# Patient Record
Sex: Female | Born: 1960
Health system: Southern US, Community
[De-identification: ages and names within clinical notes are randomized; demographics above are authoritative.]

## PROBLEM LIST (undated history)

## (undated) DIAGNOSIS — I10 Essential (primary) hypertension: Secondary | ICD-10-CM

## (undated) HISTORY — PX: OOPHORECTOMY: SHX86

---

## 1997-05-01 ENCOUNTER — Inpatient Hospital Stay (HOSPITAL_COMMUNITY): Admission: AD | Admit: 1997-05-01 | Discharge: 1997-05-03 | Payer: Self-pay | Admitting: Obstetrics and Gynecology

## 1997-11-10 ENCOUNTER — Ambulatory Visit (HOSPITAL_COMMUNITY): Admission: RE | Admit: 1997-11-10 | Discharge: 1997-11-10 | Payer: Self-pay | Admitting: Obstetrics and Gynecology

## 1998-08-10 ENCOUNTER — Other Ambulatory Visit: Admission: RE | Admit: 1998-08-10 | Discharge: 1998-08-10 | Payer: Self-pay | Admitting: Obstetrics and Gynecology

## 1999-02-05 ENCOUNTER — Inpatient Hospital Stay (HOSPITAL_COMMUNITY): Admission: AD | Admit: 1999-02-05 | Discharge: 1999-02-05 | Payer: Self-pay | Admitting: Obstetrics & Gynecology

## 2000-07-31 ENCOUNTER — Emergency Department (HOSPITAL_COMMUNITY): Admission: EM | Admit: 2000-07-31 | Discharge: 2000-08-01 | Payer: Self-pay | Admitting: Emergency Medicine

## 2000-08-16 ENCOUNTER — Ambulatory Visit (HOSPITAL_COMMUNITY): Admission: RE | Admit: 2000-08-16 | Discharge: 2000-08-16 | Payer: Self-pay | Admitting: *Deleted

## 2000-08-16 ENCOUNTER — Encounter: Payer: Self-pay | Admitting: *Deleted

## 2000-08-17 ENCOUNTER — Encounter: Payer: Self-pay | Admitting: *Deleted

## 2001-09-26 ENCOUNTER — Encounter: Admission: RE | Admit: 2001-09-26 | Discharge: 2001-09-26 | Payer: Self-pay | Admitting: Family Medicine

## 2001-09-26 ENCOUNTER — Encounter: Payer: Self-pay | Admitting: Family Medicine

## 2003-04-20 ENCOUNTER — Other Ambulatory Visit: Admission: RE | Admit: 2003-04-20 | Discharge: 2003-04-20 | Payer: Self-pay | Admitting: Obstetrics and Gynecology

## 2006-01-25 ENCOUNTER — Ambulatory Visit (HOSPITAL_COMMUNITY): Admission: RE | Admit: 2006-01-25 | Discharge: 2006-01-25 | Payer: Self-pay | Admitting: Obstetrics and Gynecology

## 2006-10-12 ENCOUNTER — Ambulatory Visit (HOSPITAL_COMMUNITY): Admission: RE | Admit: 2006-10-12 | Discharge: 2006-10-12 | Payer: Self-pay | Admitting: Obstetrics and Gynecology

## 2010-05-24 NOTE — Op Note (Signed)
Daisy Stephens, Daisy Stephens NO.:  [id number]   MEDICAL RECORD NO.:  [id number]          PATIENT TYPE:  AMB   LOCATION:  SDC                           FACILITY:  [facility]   PHYSICIAN:  Malva Limes, M.D.    DATE OF BIRTH:  April 20, 1960   DATE OF PROCEDURE:  10/12/2006  DATE OF DISCHARGE:                               OPERATIVE REPORT   PREOPERATIVE DIAGNOSIS:  Menorrhagia.   POSTOPERATIVE DIAGNOSIS:  Menorrhagia.   PROCEDURE:  Hysteroscopy with rollerball endometrial ablation.   SURGEON:  Malva Limes, M.D.   ANESTHESIA:  General.   ANTIBIOTICS:  Ancef 1 gram.   DRAINS:  Foley catheter to bladder.   SPECIMENS:  None.   COMPLICATIONS:  None.   ESTIMATED BLOOD LOSS:  Minimal.   DESCRIPTION OF PROCEDURE:  The patient was taken to the operating room  where she was placed in the dorsal supine position.  General anesthesia  was administered without complications.  She was then prepped and draped  in the usual fashion for this procedure.  Her bladder was drained with a  catheter.  Sterile speculum was placed in the vagina.  17 mL of 1%  lidocaine was used for paracervical block.  Single tooth tenaculum was  applied to the anterior cervical lip.  The cervix was serially dilated  to a 31 [place].  The hysteroscope was passed through the endocervical  canal.  On entering the uterine cavity, the patient's ostia were easily  visualized.  There was no evidence of any polyps or submucosal fibroids.  At this point, the resectoscope was placed into the uterine cavity and  the rollerball used to ablate the entire endometrial lining.  Once this  was completed, the procedure was concluded.  The patient was awakened  and taken to the recovery room in stable condition.  She will be  discharged to home.  She will be sent home with Percocet to take p.r.n.  She will follow up in the office in two weeks.           ______________________________  Malva Limes, M.D.     MA/MEDQ   D:  10/12/2006  T:  10/12/2006  Job:  [id number]

## 2010-05-27 NOTE — Op Note (Signed)
NAMESABRINIA, PRIEN NO.:  [id number]   MEDICAL RECORD NO.:  [id number]          PATIENT TYPE:  AMB   LOCATION:  SDC                           FACILITY:  [facility]   PHYSICIAN:  Malva Limes, M.D.    DATE OF BIRTH:  03/22/1960   DATE OF PROCEDURE:  01/25/2006  DATE OF DISCHARGE:                               OPERATIVE REPORT   PREOPERATIVE DIAGNOSIS:  Menorrhagia.   POSTOPERATIVE DIAGNOSIS:  Menorrhagia.   OPERATION PERFORMED:  Novasure endometrial ablation.   SURGEON:  Malva Limes, M.D.   ANESTHESIA:  MAC with paracervical block.   DRAINS:  None.   ANTIBIOTICS:  Ancef 1 g.   ESTIMATED BLOOD LOSS:  Minimal.   COMPLICATIONS:  None.   DESCRIPTION OF PROCEDURE:  The patient was taken to the operating room  and placed in dorsal lithotomy position.  MAC anesthesia was  administered without complications.  She was then prepped with Betadine  and draped in the usual fashion for this procedure.  Examination under  anesthesia revealed an 8-week sized uterus.  The patient has two known  small fibroids.  A weighted speculum was placed in the vagina.  20 mL of  1% lidocaine was used for paracervical block.  The cervix was then  serially dilated to a 19 [place].  The uterus was sounded to 8 cm.  The  cervical length was found to be 3 cm.  Therefore the cavitary length was  5 cm.  At this point the Novasure device was advanced to the fundus and  opened.  The width of the device was 3.8 cm.  A seal test was performed  and passed.  At this point the device was turned on for 54 seconds.  Total wattage of 105.  Once this was completed, the device was removed.  The patient tolerated the procedure well.  She was taken to recovery  room in stable condition.  Instrument and lap counts correct x 1.  The  patient was discharged to home.  She was instructed to follow up in the  office in four weeks.  She was sent home with Percocet to take p.r.n.     ______________________________  Malva Limes, M.D.     MA/MEDQ  D:  01/25/2006  T:  01/25/2006  Job:  [id number]

## 2010-10-20 LAB — CBC
HCT: 35.9 — ABNORMAL LOW
Hemoglobin: 12
MCHC: 33.4
MCV: 77.3 — ABNORMAL LOW
Platelets: 248
RBC: 4.64
RDW: 15.2 — ABNORMAL HIGH
WBC: 6.8

## 2010-10-20 LAB — BASIC METABOLIC PANEL
BUN: 9
CO2: 28
Calcium: 9
Chloride: 103
Creatinine, Ser: 0.64
GFR calc Af Amer: >60
GFR calc non Af Amer: >60
Glucose, Bld: 91
Potassium: 3.5
Sodium: 135

## 2010-10-20 LAB — PREGNANCY, URINE: Preg Test, Ur: NEGATIVE

## 2013-11-06 ENCOUNTER — Other Ambulatory Visit (HOSPITAL_COMMUNITY)
Admission: RE | Admit: 2013-11-06 | Discharge: 2013-11-06 | Disposition: A | Discharge status: Home / Self Care | Payer: Self-pay | Source: Ambulatory Visit | Attending: Family Medicine | Admitting: Family Medicine

## 2013-11-06 ENCOUNTER — Other Ambulatory Visit: Payer: Self-pay | Admitting: Family Medicine

## 2013-11-06 DIAGNOSIS — Z1151 Encounter for screening for human papillomavirus (HPV): Secondary | ICD-10-CM | POA: Insufficient documentation

## 2013-11-06 DIAGNOSIS — Z124 Encounter for screening for malignant neoplasm of cervix: Secondary | ICD-10-CM | POA: Insufficient documentation

## 2013-11-06 DIAGNOSIS — Z113 Encounter for screening for infections with a predominantly sexual mode of transmission: Secondary | ICD-10-CM | POA: Insufficient documentation

## 2013-11-07 LAB — CYTOLOGY - PAP

## 2015-09-16 ENCOUNTER — Emergency Department (HOSPITAL_COMMUNITY)
Admission: EM | Admit: 2015-09-16 | Discharge: 2015-09-16 | Disposition: A | Discharge status: Home / Self Care | Payer: Commercial Managed Care - HMO | Attending: Emergency Medicine | Admitting: Emergency Medicine

## 2015-09-16 ENCOUNTER — Encounter (HOSPITAL_COMMUNITY): Payer: Self-pay

## 2015-09-16 DIAGNOSIS — Y92410 Unspecified street and highway as the place of occurrence of the external cause: Secondary | ICD-10-CM | POA: Insufficient documentation

## 2015-09-16 DIAGNOSIS — M549 Dorsalgia, unspecified: Secondary | ICD-10-CM | POA: Diagnosis present

## 2015-09-16 DIAGNOSIS — Z5321 Procedure and treatment not carried out due to patient leaving prior to being seen by health care provider: Secondary | ICD-10-CM | POA: Insufficient documentation

## 2015-09-16 DIAGNOSIS — Y9389 Activity, other specified: Secondary | ICD-10-CM | POA: Diagnosis not present

## 2015-09-16 DIAGNOSIS — I10 Essential (primary) hypertension: Secondary | ICD-10-CM | POA: Insufficient documentation

## 2015-09-16 DIAGNOSIS — Y999 Unspecified external cause status: Secondary | ICD-10-CM | POA: Insufficient documentation

## 2015-09-16 HISTORY — DX: Essential (primary) hypertension: I10

## 2015-09-16 NOTE — ED Notes (Signed)
Bed: WA21 Expected date:  Expected time:  Means of arrival:  Comments: 
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
## 2015-09-16 NOTE — ED Triage Notes (Signed)
Called for patient.  Not found in lobby 

## 2015-09-16 NOTE — ED Notes (Signed)
Bed: WA26 Expected date:  Expected time:  Means of arrival:  Comments: 

## 2016-02-16 DIAGNOSIS — A084 Viral intestinal infection, unspecified: Secondary | ICD-10-CM | POA: Diagnosis not present

## 2016-06-23 DIAGNOSIS — Z0000 Encounter for general adult medical examination without abnormal findings: Secondary | ICD-10-CM | POA: Diagnosis not present

## 2016-06-23 DIAGNOSIS — R252 Cramp and spasm: Secondary | ICD-10-CM | POA: Diagnosis not present

## 2016-06-23 DIAGNOSIS — G4700 Insomnia, unspecified: Secondary | ICD-10-CM | POA: Diagnosis not present

## 2016-06-23 DIAGNOSIS — I10 Essential (primary) hypertension: Secondary | ICD-10-CM | POA: Diagnosis not present

## 2016-06-26 ENCOUNTER — Other Ambulatory Visit: Payer: Self-pay | Admitting: Family Medicine

## 2016-06-26 ENCOUNTER — Other Ambulatory Visit (HOSPITAL_COMMUNITY)
Admission: RE | Admit: 2016-06-26 | Discharge: 2016-06-26 | Disposition: A | Discharge status: Home / Self Care | Payer: 59 | Source: Ambulatory Visit | Attending: Family Medicine | Admitting: Family Medicine

## 2016-06-26 DIAGNOSIS — Z01419 Encounter for gynecological examination (general) (routine) without abnormal findings: Secondary | ICD-10-CM | POA: Insufficient documentation

## 2016-06-26 DIAGNOSIS — R21 Rash and other nonspecific skin eruption: Secondary | ICD-10-CM | POA: Diagnosis not present

## 2016-06-26 DIAGNOSIS — Z0000 Encounter for general adult medical examination without abnormal findings: Secondary | ICD-10-CM | POA: Diagnosis not present

## 2016-06-27 ENCOUNTER — Telehealth: Payer: Self-pay

## 2016-06-27 ENCOUNTER — Other Ambulatory Visit: Payer: Self-pay | Admitting: Family Medicine

## 2016-06-27 DIAGNOSIS — Z1231 Encounter for screening mammogram for malignant neoplasm of breast: Secondary | ICD-10-CM

## 2016-06-27 NOTE — Telephone Encounter (Signed)
SENT NOTES TO SCHEDULING 

## 2016-06-28 LAB — CYTOLOGY - PAP: Diagnosis: NEGATIVE

## 2016-07-21 ENCOUNTER — Ambulatory Visit: Payer: Commercial Managed Care - HMO

## 2016-08-14 DIAGNOSIS — I10 Essential (primary) hypertension: Secondary | ICD-10-CM | POA: Insufficient documentation

## 2016-08-14 DIAGNOSIS — Z01419 Encounter for gynecological examination (general) (routine) without abnormal findings: Secondary | ICD-10-CM | POA: Diagnosis not present

## 2016-08-14 DIAGNOSIS — N393 Stress incontinence (female) (male): Secondary | ICD-10-CM | POA: Insufficient documentation

## 2016-08-16 ENCOUNTER — Other Ambulatory Visit: Payer: Self-pay | Admitting: Obstetrics and Gynecology

## 2016-08-16 DIAGNOSIS — R928 Other abnormal and inconclusive findings on diagnostic imaging of breast: Secondary | ICD-10-CM

## 2016-08-25 ENCOUNTER — Ambulatory Visit
Admission: RE | Admit: 2016-08-25 | Discharge: 2016-08-25 | Disposition: A | Discharge status: Home / Self Care | Payer: Commercial Managed Care - HMO | Source: Ambulatory Visit | Attending: Obstetrics and Gynecology | Admitting: Obstetrics and Gynecology

## 2016-08-25 ENCOUNTER — Ambulatory Visit: Payer: Commercial Managed Care - HMO

## 2016-08-25 DIAGNOSIS — R928 Other abnormal and inconclusive findings on diagnostic imaging of breast: Secondary | ICD-10-CM

## 2016-08-25 DIAGNOSIS — R922 Inconclusive mammogram: Secondary | ICD-10-CM | POA: Diagnosis not present

## 2017-10-31 ENCOUNTER — Encounter: Payer: Self-pay | Admitting: Cardiology

## 2017-10-31 DIAGNOSIS — G4700 Insomnia, unspecified: Secondary | ICD-10-CM | POA: Diagnosis not present

## 2017-10-31 DIAGNOSIS — I10 Essential (primary) hypertension: Secondary | ICD-10-CM | POA: Diagnosis not present

## 2018-02-01 DIAGNOSIS — Z0000 Encounter for general adult medical examination without abnormal findings: Secondary | ICD-10-CM | POA: Diagnosis not present

## 2018-02-01 DIAGNOSIS — Z1211 Encounter for screening for malignant neoplasm of colon: Secondary | ICD-10-CM | POA: Diagnosis not present

## 2018-02-01 DIAGNOSIS — I10 Essential (primary) hypertension: Secondary | ICD-10-CM | POA: Diagnosis not present

## 2018-02-08 DIAGNOSIS — I10 Essential (primary) hypertension: Secondary | ICD-10-CM | POA: Diagnosis not present

## 2018-02-21 ENCOUNTER — Other Ambulatory Visit: Payer: Self-pay

## 2018-02-23 NOTE — Progress Notes (Deleted)
Cardiology Office Note   Date:  02/23/2018   ID:  Daisy Stephens, DOB December 18, 1960, MRN [id number]  PCP:  Levi Aland, MD  Cardiologist:   No primary care provider on file. Referring:  ***  No chief complaint on file.     History of Present Illness: Daisy Stephens is a 58 y.o. female who is referred by *** for evaluation of chest pain.  ***   She recently was started on amlodipine for hypertension.  Past Medical History:  Diagnosis Date  . Hypertension     Past Surgical History:  Procedure Laterality Date  . OOPHORECTOMY       Current Outpatient Medications  Medication Sig Dispense Refill  . amLODipine-benazepril (LOTREL) 5-40 MG capsule Take 1 capsule by mouth daily.    . APPLE CIDER VINEGAR PO Take by mouth.    . traZODone (DESYREL) 50 MG tablet trazodone 50 mg tablet    . valACYclovir (VALTREX) 1000 MG tablet valacyclovir 1 gram tablet     No current facility-administered medications for this visit.     Allergies:   Patient has no active allergies.    Social History:  The patient  reports that she has never smoked. She has never used smokeless tobacco. She reports current alcohol use. She reports that she does not use drugs.   Family History:  The patient's ***family history includes Diabetes in her mother; High blood pressure in her mother; High blood pressure (age of onset: 36) in her father; Kidney cancer (age of onset: 23) in her mother; Stroke in her brother.    ROS:  Please see the history of present illness.   Otherwise, review of systems are positive for insomnia,***.   All other systems are reviewed and negative.    PHYSICAL EXAM: VS:  There were no vitals taken for this visit. , BMI There is no height or weight on file to calculate BMI. GENERAL:  Well appearing HEENT:  Pupils equal round and reactive, fundi not visualized, oral mucosa unremarkable NECK:  No jugular venous distention, waveform within normal limits, carotid upstroke brisk and  symmetric, no bruits, no thyromegaly LYMPHATICS:  No cervical, inguinal adenopathy LUNGS:  Clear to auscultation bilaterally BACK:  No CVA tenderness CHEST:  Unremarkable HEART:  PMI not displaced or sustained,S1 and S2 within normal limits, no S3, no S4, no clicks, no rubs, *** murmurs ABD:  Flat, positive bowel sounds normal in frequency in pitch, no bruits, no rebound, no guarding, no midline pulsatile mass, no hepatomegaly, no splenomegaly EXT:  2 plus pulses throughout, no edema, no cyanosis no clubbing SKIN:  No rashes no nodules NEURO:  Cranial nerves II through XII grossly intact, motor grossly intact throughout PSYCH:  Cognitively intact, oriented to person place and time    EKG:  EKG {ACTION; IS/IS GNO:[id number]} ordered today. The ekg ordered today demonstrates ***   Recent Labs: No results found for requested labs within last 8760 hours.    Lipid Panel No results found for: CHOL, TRIG, HDL, CHOLHDL, VLDL, LDLCALC, LDLDIRECT    Wt Readings from Last 3 Encounters:  No data found for Wt      Other studies Reviewed: Additional studies/ records that were reviewed today include: ***. Review of the above records demonstrates:  Please see elsewhere in the note.  ***   ASSESSMENT AND PLAN:  CHEST PAIN:  ***   PALPITATIONS:  ***   Current medicines are reviewed at length with the patient today.  The  patient {ACTIONS; HAS/DOES NOT HAVE:19233} concerns regarding medicines.  The following changes have been made:  {PLAN; NO CHANGE:13088:s}  Labs/ tests ordered today include: *** No orders of the defined types were placed in this encounter.    Disposition:   FU with ***    Signed, Rollene Rotunda, MD  02/23/2018 1:59 PM    [facility]

## 2018-02-26 ENCOUNTER — Ambulatory Visit: Payer: 59 | Admitting: Cardiology

## 2018-07-17 ENCOUNTER — Other Ambulatory Visit: Payer: Self-pay | Admitting: Critical Care Medicine

## 2018-07-17 DIAGNOSIS — Z20822 Contact with and (suspected) exposure to covid-19: Secondary | ICD-10-CM

## 2018-07-22 LAB — NOVEL CORONAVIRUS, NAA: SARS-CoV-2, NAA: NOT DETECTED

## 2018-11-20 ENCOUNTER — Ambulatory Visit: Payer: 59 | Admitting: Podiatry

## 2018-11-20 ENCOUNTER — Encounter: Payer: Self-pay | Admitting: Podiatry

## 2018-11-20 ENCOUNTER — Other Ambulatory Visit: Payer: Self-pay | Admitting: Podiatry

## 2018-11-20 ENCOUNTER — Other Ambulatory Visit: Payer: Self-pay

## 2018-11-20 ENCOUNTER — Ambulatory Visit (INDEPENDENT_AMBULATORY_CARE_PROVIDER_SITE_OTHER): Payer: 59

## 2018-11-20 VITALS — BP 194/111

## 2018-11-20 DIAGNOSIS — M79672 Pain in left foot: Secondary | ICD-10-CM | POA: Diagnosis not present

## 2018-11-20 DIAGNOSIS — M779 Enthesopathy, unspecified: Secondary | ICD-10-CM

## 2018-11-20 DIAGNOSIS — M7751 Other enthesopathy of right foot: Secondary | ICD-10-CM | POA: Diagnosis not present

## 2018-11-20 DIAGNOSIS — M79671 Pain in right foot: Secondary | ICD-10-CM

## 2018-11-20 DIAGNOSIS — M7752 Other enthesopathy of left foot: Secondary | ICD-10-CM

## 2018-11-20 NOTE — Progress Notes (Signed)
Subjective:   Patient ID: Daisy Stephens, female   DOB: 58 y.o.   MRN: [id number]   HPI Patient presents with chronic lesion formation plantar aspect both feet that are sore with inflammation and works on cement floors and states she needs orthotics to try to take pressure off her feet as they do get sore with activity.  Patient does have elevated blood pressure today that she is going to check and does not smoke likes to be active   Review of Systems  All other systems reviewed and are negative.       Objective:  Physical Exam Vitals signs and nursing note reviewed.  Constitutional:      Appearance: She is well-developed.  Pulmonary:     Effort: Pulmonary effort is normal.  Musculoskeletal: Normal range of motion.  Skin:    General: Skin is warm.  Neurological:     Mental Status: She is alert.     Neurovascular status intact muscle strength found to be adequate range of motion within normal limits.  Patient has plantar keratotic regions around the second metatarsal of both feet that are painful and make it hard for her to walk and patient is noted to have good digital perfusion well oriented x3 with blood pressure that is elevated currently     Assessment:  Inflammatory capsulitis with keratotic lesion bilateral that are painful when palpated     Plan:  H&P discussed condition and I recommended a [place] type orthotic to try to take all stress off the heel arch with padding around the metatarsal region bilateral.  She is casted for these today and I debrided lesions and these will be dispensed by ped orthotist  X-rays indicate that there is moderate depression of the arch bilateral with mild digital deformity bilateral

## 2018-12-19 ENCOUNTER — Other Ambulatory Visit: Payer: Self-pay

## 2018-12-19 ENCOUNTER — Ambulatory Visit: Payer: 59 | Admitting: Orthotics

## 2018-12-19 DIAGNOSIS — M79671 Pain in right foot: Secondary | ICD-10-CM

## 2018-12-19 DIAGNOSIS — M79672 Pain in left foot: Secondary | ICD-10-CM

## 2018-12-19 NOTE — Progress Notes (Signed)
Patient came in today to pick up custom made foot orthotics.  The goals were accomplished and the patient reported no dissatisfaction with said orthotics.  Patient was advised of breakin period and how to report any issues. 

## 2019-09-24 ENCOUNTER — Other Ambulatory Visit: Payer: 59

## 2020-11-29 ENCOUNTER — Ambulatory Visit
Admission: RE | Admit: 2020-11-29 | Discharge: 2020-11-29 | Disposition: A | Discharge status: Home / Self Care | Payer: Worker's Compensation | Source: Ambulatory Visit | Attending: Emergency Medicine | Admitting: Emergency Medicine

## 2020-11-29 ENCOUNTER — Other Ambulatory Visit: Payer: Self-pay | Admitting: Emergency Medicine

## 2020-11-29 DIAGNOSIS — M79642 Pain in left hand: Secondary | ICD-10-CM

## 2021-01-17 ENCOUNTER — Other Ambulatory Visit: Payer: Self-pay | Admitting: Obstetrics and Gynecology

## 2021-01-17 DIAGNOSIS — Z1231 Encounter for screening mammogram for malignant neoplasm of breast: Secondary | ICD-10-CM

## 2021-02-07 ENCOUNTER — Ambulatory Visit
Admission: RE | Admit: 2021-02-07 | Discharge: 2021-02-07 | Disposition: A | Discharge status: Home / Self Care | Payer: 59 | Source: Ambulatory Visit | Attending: Obstetrics and Gynecology | Admitting: Obstetrics and Gynecology

## 2021-02-07 DIAGNOSIS — Z1231 Encounter for screening mammogram for malignant neoplasm of breast: Secondary | ICD-10-CM

## 2021-03-01 ENCOUNTER — Ambulatory Visit: Payer: 59 | Admitting: Dermatology

## 2021-05-08 ENCOUNTER — Encounter (HOSPITAL_BASED_OUTPATIENT_CLINIC_OR_DEPARTMENT_OTHER): Payer: Self-pay | Admitting: *Deleted

## 2021-05-08 ENCOUNTER — Emergency Department (HOSPITAL_BASED_OUTPATIENT_CLINIC_OR_DEPARTMENT_OTHER): Payer: 59

## 2021-05-08 ENCOUNTER — Other Ambulatory Visit: Payer: Self-pay

## 2021-05-08 ENCOUNTER — Emergency Department (HOSPITAL_BASED_OUTPATIENT_CLINIC_OR_DEPARTMENT_OTHER)
Admission: EM | Admit: 2021-05-08 | Discharge: 2021-05-08 | Disposition: A | Discharge status: Home / Self Care | Payer: 59 | Attending: Emergency Medicine | Admitting: Emergency Medicine

## 2021-05-08 DIAGNOSIS — R519 Headache, unspecified: Secondary | ICD-10-CM | POA: Insufficient documentation

## 2021-05-08 DIAGNOSIS — R202 Paresthesia of skin: Secondary | ICD-10-CM | POA: Diagnosis not present

## 2021-05-08 DIAGNOSIS — Z79899 Other long term (current) drug therapy: Secondary | ICD-10-CM | POA: Insufficient documentation

## 2021-05-08 DIAGNOSIS — R200 Anesthesia of skin: Secondary | ICD-10-CM | POA: Diagnosis present

## 2021-05-08 DIAGNOSIS — I10 Essential (primary) hypertension: Secondary | ICD-10-CM | POA: Diagnosis not present

## 2021-05-08 LAB — COMPREHENSIVE METABOLIC PANEL
ALT: 10 U/L (ref 0–44)
AST: 12 U/L — ABNORMAL LOW (ref 15–41)
Albumin: 4.4 g/dL (ref 3.5–5.0)
Alkaline Phosphatase: 71 U/L (ref 38–126)
Anion gap: 9 (ref 5–15)
BUN: 20 mg/dL (ref 6–20)
CO2: 26 mmol/L (ref 22–32)
Calcium: 9.6 mg/dL (ref 8.9–10.3)
Chloride: 106 mmol/L (ref 98–111)
Creatinine, Ser: 0.7 mg/dL (ref 0.44–1.00)
GFR, Estimated: >60 mL/min (ref >60)
Glucose, Bld: 124 mg/dL — ABNORMAL HIGH (ref 70–99)
Potassium: 3.3 mmol/L — ABNORMAL LOW (ref 3.5–5.1)
Sodium: 141 mmol/L (ref 135–145)
Total Bilirubin: 0.4 mg/dL (ref 0.3–1.2)
Total Protein: 7.2 g/dL (ref 6.5–8.1)

## 2021-05-08 LAB — DIFFERENTIAL
Abs Immature Granulocytes: 0.02 10*3/uL (ref 0.00–0.07)
Basophils Absolute: 0 10*3/uL (ref 0.0–0.1)
Basophils Relative: 0 %
Eosinophils Absolute: 0.2 10*3/uL (ref 0.0–0.5)
Eosinophils Relative: 2 %
Immature Granulocytes: 0 %
Lymphocytes Relative: 33 %
Lymphs Abs: 2.7 10*3/uL (ref 0.7–4.0)
Monocytes Absolute: 0.6 10*3/uL (ref 0.1–1.0)
Monocytes Relative: 8 %
Neutro Abs: 4.6 10*3/uL (ref 1.7–7.7)
Neutrophils Relative %: 57 %

## 2021-05-08 LAB — CBC
HCT: 36.6 % (ref 36.0–46.0)
Hemoglobin: 12 g/dL (ref 12.0–15.0)
MCH: 27 pg (ref 26.0–34.0)
MCHC: 32.8 g/dL (ref 30.0–36.0)
MCV: 82.4 fL (ref 80.0–100.0)
Platelets: 245 10*3/uL (ref 150–400)
RBC: 4.44 MIL/uL (ref 3.87–5.11)
RDW: 13.2 % (ref 11.5–15.5)
WBC: 8.2 10*3/uL (ref 4.0–10.5)
nRBC: 0 % (ref 0.0–0.2)

## 2021-05-08 NOTE — ED Provider Notes (Signed)
?[facility] ?Provider Note ? ? ?CSN: [id number] ?Arrival date & time: 05/08/21  0130 ? ?  ? ?History ? ?Chief Complaint  ?Patient presents with  ? Other  ?  Left arm numbness/tingling   ? ? ?Daisy Stephens is a 61 y.o. female. ? ?The history is provided by the patient and a relative.  ?Neurologic Problem ?This is a new problem. The problem has been resolved. Associated symptoms include headaches. Pertinent negatives include no chest pain and no shortness of breath. Nothing aggravates the symptoms. Nothing relieves the symptoms. She has tried nothing for the symptoms.  ?Patient presents with left-sided numbness.  She reports that last evening she had an episode of numbness in the left face and left arm tingling.  The episode lasted about 5 minutes and resolved.  No focal weakness.  No facial weakness.  No visual changes.  She has similar episode earlier in the week.  She is already been seen by her PCP who is referring her to neurology and begin an outpatient work-up.  The episode earlier in the night occurred at work and she was encouraged to come to the ER. ?She has no previous history of stroke.  She has a previous history of hypertension which is being treated ?She reports she is ambulatory without difficulty ?  ? ?Home Medications ?Prior to Admission medications   ?Medication Sig Start Date End Date Taking? Authorizing Provider  ?amLODipine-benazepril (LOTREL) 5-40 MG capsule Take 1 capsule by mouth daily.    [provider]  ?APPLE CIDER VINEGAR PO Take by mouth.    [provider]  ?ibuprofen (ADVIL) 800 MG tablet Take 800 mg by mouth 3 (three) times daily. 09/06/18   [provider]  ?traZODone (DESYREL) 50 MG tablet trazodone 50 mg tablet    [provider]  ?valACYclovir (VALTREX) 1000 MG tablet valacyclovir 1 gram tablet    [provider]  ?   ? ?Allergies    ?Patient has no known allergies.   ? ?Review of Systems   ?Review of Systems   ?Constitutional:  Negative for fever.  ?Eyes:  Negative for visual disturbance.  ?Respiratory:  Negative for shortness of breath.   ?Cardiovascular:  Negative for chest pain.  ?Neurological:  Positive for headaches. Negative for dizziness, facial asymmetry, speech difficulty and weakness.  ? ?Physical Exam ?Updated Vital Signs ?BP 127/81   Pulse (!) 57   Temp 98.6 ?F (37 ?C) (Oral)   Resp 16   Ht 1.753 m (5\' 9" )   Wt 90.7 kg   SpO2 98%   BMI 29.53 kg/m?  ?Physical Exam ?CONSTITUTIONAL: Well developed/well nourished ?HEAD: Normocephalic/atraumatic ?EYES: EOMI/PERRL, no nystagmus, no visual ?field deficit  no ptosis ?ENMT: Mucous membranes moist ?NECK: supple no meningeal signs, no bruits ?CV: S1/S2 noted, no murmurs/rubs/gallops noted ?LUNGS: Lungs are clear to auscultation bilaterally, no apparent distress ?ABDOMEN: soft, nontender, no rebound or guarding ?GU:no cva tenderness ?NEURO:Awake/alert, face symmetric, no arm or leg drift is noted ?Equal 5/5 strength with shoulder abduction, elbow flex/extension, wrist flex/extension in upper extremities and equal hand grips bilaterally ?Equal 5/5 strength with hip flexion,knee flex/extension, foot dorsi/plantar flexion ?Cranial nerves 3/4/5/6/07/17/08/11/12 tested and intact ?No past pointing ?Sensation to light touch intact in all extremities ?EXTREMITIES: pulses normal, full ROM ?SKIN: warm, color normal ?PSYCH: no abnormalities of mood noted ? ? ?ED Results / Procedures / Treatments   ?Labs ?(all labs ordered are listed, but only abnormal results are displayed) ?Labs Reviewed  ?COMPREHENSIVE  METABOLIC PANEL - Abnormal; Notable for the following components:  ?    Result Value  ? Potassium 3.3 (*)   ? Glucose, Bld 124 (*)   ? AST 12 (*)   ? All other components within normal limits  ?CBC  ?DIFFERENTIAL  ? ? ?EKG ?EKG Interpretation ? ?Date/Time:  Sunday May 08 2021 05:13:27 EDT ?Ventricular Rate:  64 ?PR Interval:  186 ?QRS Duration: 102 ?QT Interval:  420 ?QTC  Calculation: 434 ?R Axis:   69 ?Text Interpretation: Sinus rhythm Probable anteroseptal infarct, old Minimal ST elevation, inferior leads Confirmed by Zadie RhineWickline, Aleysia Oltmann ([id number]) on 05/08/2021 5:39:07 AM ? ?Radiology ?CT HEAD WO CONTRAST ? ?Result Date: 05/08/2021 ?CLINICAL DATA:  61 year old female with intermittent left face and upper extremity paresthesia. EXAM: CT HEAD WITHOUT CONTRAST TECHNIQUE: Contiguous axial images were obtained from the base of the skull through the vertex without intravenous contrast. RADIATION DOSE REDUCTION: This exam was performed according to the departmental dose-optimization program which includes automated exposure control, adjustment of the mA and/or kV according to patient size and/or use of iterative reconstruction technique. COMPARISON:  None. FINDINGS: Brain: Normal cerebral volume. No midline shift, mass effect, or evidence of intracranial mass lesion. No ventriculomegaly. Moderately advanced patchy bilateral cerebral white matter hypodensity. Deep gray matter nuclei appear relatively spared. No acute or chronic cortically based infarct identified. No acute intracranial hemorrhage identified. Vascular: Calcified atherosclerosis at the skull base. No suspicious intracranial vascular hyperdensity. Skull: No osseous abnormality identified. Sinuses/Orbits: Visualized paranasal sinuses and mastoids are clear. Other: Visualized orbits and scalp soft tissues are within normal limits. IMPRESSION: Advanced for age cerebral white matter changes, nonspecific but most commonly due to chronic small vessel disease. Electronically Signed   By: Odessa FlemingH  Hall M.D.   On: 05/08/2021 06:31   ? ?Procedures ?Procedures  ? ? ?Medications Ordered in ED ?Medications - No data to display ? ?ED Course/ Medical Decision Making/ A&P ?Clinical Course as of 05/08/21 0644  ?Sun May 08, 2021  ?[id number] Work-up was overall unremarkable.  She has no new neurologic symptoms.  CT scan negative for ICH or other acute neurologic  emergency. [DW]  ?[phone] Patient can continue the outpatient work-up with her PCP.  At this time there is no signs of acute CVA.  We discussed strict return precautions [DW]  ?  ?Clinical Course User Index ?[DW] Zadie RhineWickline, Zayveon Raschke, MD  ? ?                        ?Medical Decision Making ?Amount and/or Complexity of Data Reviewed ?Labs: ordered. ?Radiology: ordered. ? ? ?This patient presents to the ED for concern of numbness, this involves an extensive number of treatment options, and is a complaint that carries with it a high risk of complications and morbidity.  The differential diagnosis includes but is not limited to electrolyte abnormality, hypokalemia, CVA, intracranial hemorrhage, brain tumor ? ?Comorbidities that complicate the patient evaluation: ?Patient?s presentation is complicated by their history of hypertension ? ? ?Lab Tests: ?I Ordered, and personally interpreted labs.  The pertinent results include: Mild hypokalemia ? ?Imaging Studies ordered: ?I ordered imaging studies including CT scan head   ?I independently visualized and interpreted imaging which showed negative ?I agree with the radiologist interpretation ? ? ?Medicines ordered and prescription drug management: ?Patient declined medication ? ?Test Considered: ?Considered MRI, but patient is symptom-free at this time, will defer further neuroimaging ? ? ?Reevaluation: ?After the interventions noted above, I reevaluated the patient  and found that they have :improved ? ?Complexity of problems addressed: ?Patient?s presentation is most consistent with  acute presentation with potential threat to life or bodily function ? ?Disposition: ?After consideration of the diagnostic results and the patient?s response to treatment,  ?I feel that the patent would benefit from discharge   .  ? ? ?6:46 AM ?Patient presented with brief isolated episodes of paresthesias that resolved after approximately 5 minutes.  She never had any visual changes or focal weakness.   She is already been seen as an outpatient and has this work-up in process.  Screening labs and CT head were overall unremarkable ?Patient to be discharged home.  We discussed strict return precautions ? ?

## 2021-05-08 NOTE — ED Notes (Signed)
Patient transported to CT 

## 2021-05-08 NOTE — ED Triage Notes (Signed)
C/o left lip numbness and left arm tingling/numbness that started on Monday. States "it doesn't last long" states that she saw her MD on Friday for the same symptoms. States she had this again tonight at work tonight at [employer]. States symptoms have resolved on arrival to ED. No obvious neuro symptoms noted. Pt is alert and oriented times 3, grips equal. Pupils equal  ?

## 2021-05-26 ENCOUNTER — Encounter: Payer: Self-pay | Admitting: Neurology

## 2021-05-26 ENCOUNTER — Ambulatory Visit: Payer: 59 | Admitting: Neurology

## 2021-05-26 VITALS — BP 134/86 | HR 78 | Ht 69.0 in | Wt 198.0 lb

## 2021-05-26 DIAGNOSIS — G4700 Insomnia, unspecified: Secondary | ICD-10-CM | POA: Insufficient documentation

## 2021-05-26 DIAGNOSIS — G4733 Obstructive sleep apnea (adult) (pediatric): Secondary | ICD-10-CM | POA: Diagnosis not present

## 2021-05-26 DIAGNOSIS — Z860100 Personal history of colon polyps, unspecified: Secondary | ICD-10-CM | POA: Insufficient documentation

## 2021-05-26 DIAGNOSIS — R252 Cramp and spasm: Secondary | ICD-10-CM | POA: Insufficient documentation

## 2021-05-26 DIAGNOSIS — R202 Paresthesia of skin: Secondary | ICD-10-CM | POA: Diagnosis not present

## 2021-05-26 DIAGNOSIS — J329 Chronic sinusitis, unspecified: Secondary | ICD-10-CM | POA: Insufficient documentation

## 2021-05-26 DIAGNOSIS — E1169 Type 2 diabetes mellitus with other specified complication: Secondary | ICD-10-CM | POA: Insufficient documentation

## 2021-05-26 DIAGNOSIS — R209 Unspecified disturbances of skin sensation: Secondary | ICD-10-CM | POA: Insufficient documentation

## 2021-05-26 NOTE — Progress Notes (Addendum)
Chief Complaint  Patient presents with   New Patient (Initial Visit)    Room 12 NP/Paper/Jessica Easley NP [place] [facility] [phone]/numbness/tingling, r/o TIA - fam hx of stroke Reports numbess and tingling x 3 weeks left arm left side of cheek, no pain, states sx lasts for a few mins       ASSESSMENT AND PLAN  Daisy Stephens is a 61 y.o. female   Sudden onset left-sided paresthesia  MRI of the brain to rule out right hemisphere small vessel TIA/stroke  Vascular risk factor of aging, hypertension, diabetes, obstructive sleep apnea, noncompliant with her CPAP machine   Obstructive sleep apnea  Referred for sleep study   DIAGNOSTIC DATA (LABS, IMAGING, TESTING) - I reviewed patient records, labs, notes, testing and imaging myself where available. Laboratory evaluation in December 2022, A1c 6.6, normal CMP with exception of elevated glucose 141, normal creatinine 0.71, LDL 99, triglyceride 202,  MEDICAL HISTORY:  Daisy Stephens, is a 61 year old female, accompanied by her daughter, seen in request by her primary care doctor Daisy Floro, for evaluation of numbness tingling of left side of the body, initial evaluation was on May 26, 2021  I reviewed and summarized the referring note. PMHX. HTN Obstructive sleep apnea, CPAP. Diabetes, A1c was 6.6 on December 21, 2020  Patient works long hours, sometimes up to 16 hours a day, often 7 times a week, she drives forklift, in May 2023, she had few episode of sudden onset numbness involving her left face, left hand, lasting for few minutes, no weakness, no dysarthria,  She was noted to have elevated A1c 6.6, made the diagnosis of diabetes, but never received any treatment  She has obstructive sleep apnea, but not compliant with her CPAP machine, has not had sleep study for over 3 years, does have loud snoring, often contributed her daytime fatigue, sleepiness due to overwork, lack of sleep,  PHYSICAL EXAM:    Vitals:   05/26/21 1055  BP: 134/86  Pulse: 78  Weight: 198 lb (89.8 kg)  Height: 5\' 9"  (1.753 m)    Body mass index is 29.24 kg/m.  PHYSICAL EXAMNIATION:  Gen: NAD, conversant, well nourised, well groomed                     Cardiovascular: Regular rate rhythm, no peripheral edema, warm, nontender. Eyes: Conjunctivae clear without exudates or hemorrhage Neck: Supple, no carotid bruits. Pulmonary: Clear to auscultation bilaterally   NEUROLOGICAL EXAM:  MENTAL STATUS: Speech/cognition: Awake, alert, oriented to history taking and casual conversation CRANIAL NERVES: CN II: Visual fields are full to confrontation. Pupils are round equal and briskly reactive to light. CN III, IV, VI: extraocular movement are normal. No ptosis. CN V: Facial sensation is intact to light touch CN VII: Face is symmetric with normal eye closure  CN VIII: Hearing is normal to causal conversation. CN IX, X: Phonation is normal. CN XI: Head turning and shoulder shrug are intact  MOTOR: There is no pronator drift of out-stretched arms. Muscle bulk and tone are normal. Muscle strength is normal.  REFLEXES: Reflexes are 2+ and symmetric at the biceps, triceps, knees, and ankles. Plantar responses are flexor.  SENSORY: Intact to light touch, pinprick and vibratory sensation are intact in fingers and toes.  COORDINATION: There is no trunk or limb dysmetria noted.  GAIT/STANCE: Posture is normal. Gait is steady with normal steps, base, arm swing, and turning. Heel and toe walking are normal. Tandem gait is  normal.  Romberg is absent.  REVIEW OF SYSTEMS:  Full 14 system review of systems performed and notable only for as above All other review of systems were negative.   ALLERGIES: No Known Allergies  HOME MEDICATIONS: Current Outpatient Medications  Medication Sig Dispense Refill   amLODipine-benazepril (LOTREL) 5-40 MG capsule Take 1 capsule by mouth daily.     APPLE CIDER VINEGAR PO  Take by mouth.     ibuprofen (ADVIL) 800 MG tablet Take 800 mg by mouth 3 (three) times daily.     traZODone (DESYREL) 50 MG tablet trazodone 50 mg tablet     valACYclovir (VALTREX) 1000 MG tablet valacyclovir 1 gram tablet     No current facility-administered medications for this visit.    PAST MEDICAL HISTORY: Past Medical History:  Diagnosis Date   Hypertension     PAST SURGICAL HISTORY: Past Surgical History:  Procedure Laterality Date   OOPHORECTOMY      FAMILY HISTORY: Family History  Problem Relation Age of Onset   Kidney cancer Mother 72   Diabetes Mother    High blood pressure Mother    High blood pressure Father 10   Stroke Brother     SOCIAL HISTORY: Social History   Socioeconomic History   Marital status: Married    Spouse name: Not on file   Number of children: Not on file   Years of education: Not on file   Highest education level: Not on file  Occupational History   Not on file  Tobacco Use   Smoking status: Never   Smokeless tobacco: Never  Substance and Sexual Activity   Alcohol use: Yes    Comment: social   Drug use: No   Sexual activity: Not on file  Other Topics Concern   Not on file  Social History Narrative   Not on file   Social Determinants of Health   Financial Resource Strain: Not on file  Food Insecurity: Not on file  Transportation Needs: Not on file  Physical Activity: Not on file  Stress: Not on file  Social Connections: Not on file  Intimate Partner Violence: Not on file      Levert Feinstein, M.D. Ph.D.  [facility] [address], [address] [place], [place] [address] Ph: [phone] Fax: [phone]  CC:  Dennie Maizes, NP [address] [place],  [place] [address]  Daisy Floro, MD  .Marland Kitchen

## 2021-06-01 ENCOUNTER — Telehealth: Payer: Self-pay | Admitting: Neurology

## 2021-06-01 MED ORDER — ALPRAZOLAM 0.5 MG PO TABS: 0.5000 mg | ORAL_TABLET | ORAL | 0 refills | Status: AC | PRN

## 2021-06-01 NOTE — Addendum Note (Signed)
Addended by: Cristela Felt E on: 06/01/2021 03:57 PM   Modules accepted: Orders

## 2021-06-01 NOTE — Telephone Encounter (Signed)
Orders pended for MD approval

## 2021-06-01 NOTE — Addendum Note (Signed)
Addended by: Levert Feinstein on: 06/01/2021 08:39 PM   Modules accepted: Orders

## 2021-06-01 NOTE — Telephone Encounter (Signed)
30 mins MRI brain wo contrast Dr. Terrace Arabia [facility] NPR case #[id number] scheduled at [facility] 06/15/21 at 10:30am

## 2021-06-01 NOTE — Telephone Encounter (Signed)
Patient would like meds for the MRI

## 2021-06-15 ENCOUNTER — Other Ambulatory Visit: Payer: 59

## 2021-06-28 ENCOUNTER — Other Ambulatory Visit: Payer: 59

## 2021-06-29 ENCOUNTER — Other Ambulatory Visit: Payer: 59

## 2021-07-05 ENCOUNTER — Ambulatory Visit (INDEPENDENT_AMBULATORY_CARE_PROVIDER_SITE_OTHER): Payer: 59

## 2021-07-05 DIAGNOSIS — R202 Paresthesia of skin: Secondary | ICD-10-CM | POA: Diagnosis not present

## 2021-07-07 ENCOUNTER — Telehealth: Payer: Self-pay | Admitting: Neurology

## 2021-07-07 DIAGNOSIS — I679 Cerebrovascular disease, unspecified: Secondary | ICD-10-CM

## 2021-07-07 NOTE — Telephone Encounter (Signed)
I spoke with the patient and informed her of the results. She verbalized understanding of the findings, will continue on medication. She inquired about her sleep study, stating she had not been contacted. I informed her that they were unable to leave a voicemail on her phone and had made multiple attempts to contact her. Will route to sleep lab to follow up. She expressed appreciation for the call.

## 2021-07-07 NOTE — Telephone Encounter (Signed)
Please call patient, MRI of the brain showed no acute abnormality, there is mild to moderate supratentorium small vessel disease, punctuate to right cerebellar chronic cerebral microhemorrhage  Above findings consistent with her multiple vascular risk factor, hypertension, diabetes, obstructive sleep apnea,   Advised her taking aspirin 81 mg daily, increase water intake,  I also ordered echocardiogram, ultrasound of carotid artery to complete evaluation  Orders Placed This Encounter  Procedures   ECHOCARDIOGRAM COMPLETE   VAS US CAROTID       MRI brain (without) demonstrating: - Mild chronic small vessel ischemic disease.  Punctate right cerebellar chronic cerebral microhemorrhage. - No acute findings.

## 2021-07-07 NOTE — Telephone Encounter (Signed)
I attempted to contact the patient. Her voicemail box was not set up and unable to receive messages. I left a voicemail on her husband, Tony's phone (as per DPR) asking for a return call.

## 2021-07-29 ENCOUNTER — Ambulatory Visit (HOSPITAL_COMMUNITY)
Admission: RE | Admit: 2021-07-29 | Discharge: 2021-07-29 | Disposition: A | Discharge status: Home / Self Care | Payer: 59 | Source: Ambulatory Visit | Attending: Neurology | Admitting: Neurology

## 2021-07-29 ENCOUNTER — Ambulatory Visit (HOSPITAL_BASED_OUTPATIENT_CLINIC_OR_DEPARTMENT_OTHER)
Admission: RE | Admit: 2021-07-29 | Discharge: 2021-07-29 | Disposition: A | Discharge status: Home / Self Care | Payer: 59 | Source: Ambulatory Visit | Attending: Neurology | Admitting: Neurology

## 2021-07-29 DIAGNOSIS — R079 Chest pain, unspecified: Secondary | ICD-10-CM

## 2021-07-29 DIAGNOSIS — I679 Cerebrovascular disease, unspecified: Secondary | ICD-10-CM | POA: Diagnosis not present

## 2021-07-29 DIAGNOSIS — E119 Type 2 diabetes mellitus without complications: Secondary | ICD-10-CM | POA: Insufficient documentation

## 2021-07-29 DIAGNOSIS — I249 Acute ischemic heart disease, unspecified: Secondary | ICD-10-CM | POA: Diagnosis not present

## 2021-07-29 DIAGNOSIS — G4730 Sleep apnea, unspecified: Secondary | ICD-10-CM | POA: Diagnosis not present

## 2021-07-29 DIAGNOSIS — I10 Essential (primary) hypertension: Secondary | ICD-10-CM | POA: Diagnosis not present

## 2021-07-29 LAB — ECHOCARDIOGRAM COMPLETE
Area-P 1/2: 3.06 cm2
S' Lateral: 2.6 cm

## 2021-07-29 NOTE — Progress Notes (Signed)
Carotid duplex has been completed.  Reading physician finalized exam before I could complete preliminary report.   Results can be found under chart review under CV PROC. 07/29/2021 3:28 PM Shawndrea Rutkowski RVT, RDMS

## 2021-09-28 NOTE — Progress Notes (Signed)
Chief Complaint  Patient presents with   Follow-up    Pt in room #2 and alone. Pt here for f/u.    HISTORY OF PRESENT ILLNESS:  10/03/21 ALL:  Daisy Stephens is a 61 y.o. female here today for follow up for paraesthesias. Hx of DM, HTN, and OSA noncompliant with CPAP. She was seen in consult with Dr Krista Blue 05/2021 for sudden onset of left sided paresthesias. MRI showed small vessel disease and right cerebellar punctate chronic microhemorrhage. Echo showed EF 65-70%. Carotid doppler showed no significant stenosis. She was referred to sleep med but has not called to schedule appt.   Since, she reports numbness of left face and tingling of left continued about 2-3 times a week until about a month ago. She stopped amlodipine as she thought this could be the cause. She reports paresthesias have nearly resolved since stopping amlodipine. She had one episode a day or two after stopping medication but paresthesias have not returned since. She saw her PCP about 2 weeks ago who started valsartan. She reports BP has been up and down.  She feels that sleep evaluation was approx 5 years ago. She does have a CPAP machine at home but not using it. She is uncertain where she was tested. She is willing to resume CPAP. She endorses loud snoring and husband tells her that she scares him at night as she stops breathing. ESS 2 for lying down to rest in the afternoons. She does not smoke. No alcohol. No stimulants or street drugs.    HISTORY (copied from Dr Rhea Belton previous note)  NAYVIE LIPS, is a 61 year old female, accompanied by her daughter, seen in request by her primary care doctor Lawerance Cruel, for evaluation of numbness tingling of left side of the body, initial evaluation was on May 26, 2021   I reviewed and summarized the referring note. PMHX. HTN Obstructive sleep apnea, CPAP. 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, she had few episode of sudden onset numbness involving her left face, left hand, lasting for few minutes, no weakness, no dysarthria,   She was noted to have elevated A1c 6.6, made the diagnosis of diabetes, but never received any treatment  She has obstructive sleep apnea, but not compliant with her CPAP machine, has not had sleep study for over 3 years, does have loud snoring, often contributed her daytime fatigue, sleepiness due to overwork, lack of sleep   REVIEW OF SYSTEMS: Out of a complete 14 system review of symptoms, the patient complains only of the following symptoms, snoring, and all other reviewed systems are negative.   ALLERGIES: No Known Allergies   HOME MEDICATIONS: Outpatient Medications Prior to Visit  Medication Sig Dispense Refill   ALPRAZolam (XANAX) 0.5 MG tablet Take 1 tablet (0.5 mg total) by mouth as needed for anxiety. Take 1 tablet by mouth 30 minutes prior to procedure. Repeat if needed. 3 tablet 0   APPLE CIDER VINEGAR PO Take by mouth.     ibuprofen (ADVIL) 800 MG tablet Take 800 mg by mouth 3 (three) times daily.     traZODone (DESYREL) 50 MG tablet trazodone 50 mg tablet     valACYclovir (VALTREX) 1000 MG tablet valacyclovir 1 gram tablet     amLODipine-benazepril (LOTREL) 5-40 MG capsule Take 1 capsule by mouth daily.  valsartan (DIOVAN) 320 MG tablet Take 320 mg by mouth daily.     No facility-administered medications prior to visit.     PAST MEDICAL HISTORY: Past Medical History:  Diagnosis Date   Hypertension      PAST SURGICAL HISTORY: Past Surgical History:  Procedure Laterality Date   OOPHORECTOMY       FAMILY HISTORY: Family History  Problem Relation Age of Onset   Kidney cancer Mother 25   Diabetes Mother    High blood pressure Mother    High blood pressure Father 21   Stroke Brother      SOCIAL HISTORY: Social History   Socioeconomic History   Marital status: Married     Spouse name: Not on file   Number of children: Not on file   Years of education: Not on file   Highest education level: Not on file  Occupational History   Not on file  Tobacco Use   Smoking status: Never   Smokeless tobacco: Never  Substance and Sexual Activity   Alcohol use: Yes    Comment: social   Drug use: No   Sexual activity: Not on file  Other Topics Concern   Not on file  Social History Narrative   Not on file   Social Determinants of Health   Financial Resource Strain: Not on file  Food Insecurity: Not on file  Transportation Needs: Not on file  Physical Activity: Not on file  Stress: Not on file  Social Connections: Not on file  Intimate Partner Violence: Not on file     PHYSICAL EXAM  Vitals:   10/03/21 1352 10/03/21 1413  BP: (!) 177/105 (!) 170/100  Pulse: 74   Weight: 193 lb 3.2 oz (87.6 kg)   Height: _0  (1.753 m)    Body mass index is 28.53 kg/m.  Generalized: Well developed, in no acute distress  Cardiology: normal rate and rhythm, no murmur auscultated  Respiratory: clear to auscultation bilaterally    Neurological examination  Mentation: Alert oriented to time, place, history taking. Follows all commands speech and language fluent Cranial nerve II-XII: Pupils were equal round reactive to light. Extraocular movements were full, visual field were full on confrontational test. Facial sensation and strength were normal. Head turning and shoulder shrug  were normal and symmetric. Motor: The motor testing reveals 5 over 5 strength of all 4 extremities. Good symmetric motor tone is noted throughout.  Gait and station: Gait is normal.   DIAGNOSTIC DATA (LABS, IMAGING, TESTING) - I reviewed patient records, labs, notes, testing and imaging myself where available.  Lab Results  Component Value Date   WBC 8.2 05/08/2021   HGB 12.0 05/08/2021   HCT 36.6 05/08/2021   MCV 82.4 05/08/2021   PLT 245 05/08/2021      Component Value Date/Time    NA 141 05/08/2021 0518   K 3.3 (L) 05/08/2021 0518   CL 106 05/08/2021 0518   CO2 26 05/08/2021 0518   GLUCOSE 124 (H) 05/08/2021 0518   BUN 20 05/08/2021 0518   CREATININE 0.70 05/08/2021 0518   CALCIUM 9.6 05/08/2021 0518   PROT 7.2 05/08/2021 0518   ALBUMIN 4.4 05/08/2021 0518   AST 12 (L) 05/08/2021 0518   ALT 10 05/08/2021 0518   ALKPHOS 71 05/08/2021 0518   BILITOT 0.4 05/08/2021 0518   GFRNONAA >60 05/08/2021 0518   GFRAA  10/11/2006 1015    >60        The eGFR has been calculated using  the MDRD equation. This calculation has not been validated in all clinical   No results found for: "CHOL", "HDL", "LDLCALC", "LDLDIRECT", "TRIG", "CHOLHDL" No results found for: "HGBA1C" No results found for: "VITAMINB12" No results found for: "TSH"      No data to display               No data to display           ASSESSMENT AND PLAN  61 y.o. year old female  has a past medical history of Hypertension. here with    Paresthesia  Obstructive sleep apnea  Cerebral vascular disease  Hypertension, unspecified type  Lukisha L Novosad reports paresthesias have resolved in the past month since stopping amlodipine-benazepril. BP has been up and down on valsartan. She is monitoring closely with RN at work. She is scheduled to call Dr Harrington Challenger with BP log in 2 weeks. We have reviewed results of MRI, ECHO and carotid doppler. I have encouraged her to continue monitoring BP closely and to call to schedule sleep evaluation with our sleep providers. She is interested in resuming CPAP. Healthy lifestyle habits encouraged. She will follow up with PCP as directed. She will return to see me pending sleep evaluation. She verbalizes understanding and agreement with this plan.   No orders of the defined types were placed in this encounter.    No orders of the defined types were placed in this encounter.    Debbora Presto, MSN, FNP-C 10/03/2021, 2:27 PM  [facility] [address], [place], [place] [address] [phone]

## 2021-09-28 NOTE — Patient Instructions (Addendum)
Below is our plan:  We will continue to monitor. Please keep an eye on your BP at home. Follow up closely with Dr Harrington Challenger for BP management. Call sleep lab to schedule appt for sleep consult. [phone]  Please make sure you are staying well hydrated. I recommend 50-60 ounces daily. Well balanced diet and regular exercise encouraged. Consistent sleep schedule with 6-8 hours recommended.   Please continue follow up with care team as directed.   Follow up pending sleep consult   You may receive a survey regarding today's visit. I encourage you to leave honest feed back as I do use this information to improve patient care. Thank you for seeing me today!

## 2021-10-03 ENCOUNTER — Encounter: Payer: Self-pay | Admitting: Family Medicine

## 2021-10-03 ENCOUNTER — Ambulatory Visit: Payer: 59 | Admitting: Family Medicine

## 2021-10-03 VITALS — BP 170/100 | HR 74 | Ht 69.0 in | Wt 193.2 lb

## 2021-10-03 DIAGNOSIS — G4733 Obstructive sleep apnea (adult) (pediatric): Secondary | ICD-10-CM

## 2021-10-03 DIAGNOSIS — I679 Cerebrovascular disease, unspecified: Secondary | ICD-10-CM | POA: Diagnosis not present

## 2021-10-03 DIAGNOSIS — R202 Paresthesia of skin: Secondary | ICD-10-CM

## 2021-10-03 DIAGNOSIS — I10 Essential (primary) hypertension: Secondary | ICD-10-CM

## 2022-07-24 IMAGING — CT CT HEAD W/O CM
4 series · 16 of 47 positions shown, 18 images · non-contrast
Comparison: None.

CLINICAL DATA: 61-year-old female with intermittent left face and
upper extremity paresthesia.



[Series 2: head wo · axial · 0.40mm/px · z∈[+645,+755]mm · 7 of 30 slices shown, 9 images]
[im 4/30  brain]
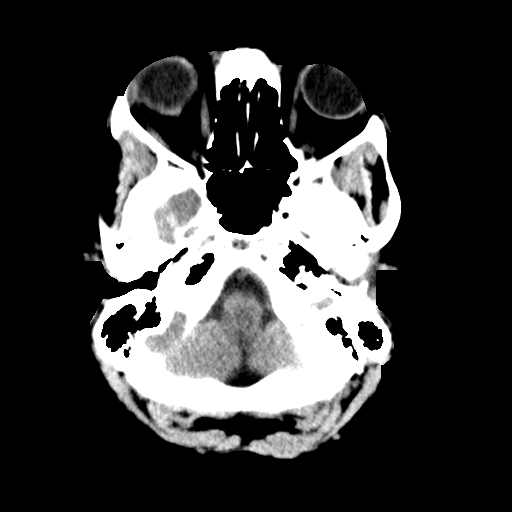
[im 4/30  bone]
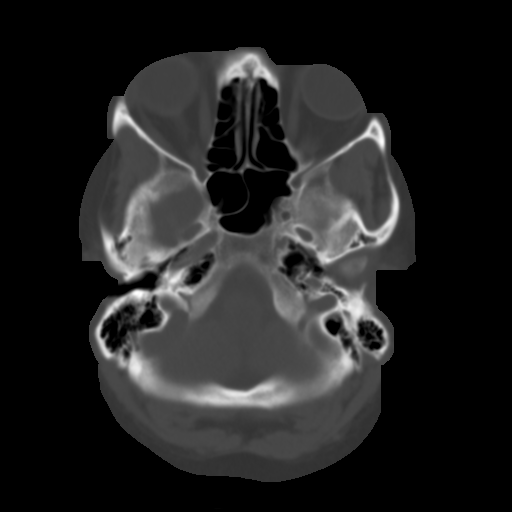
[im 8/30  brain]
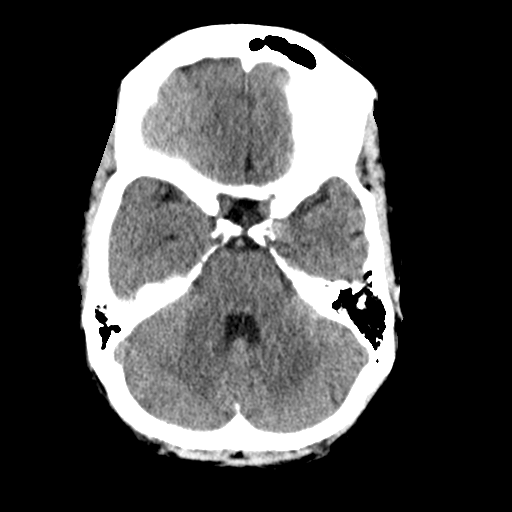
[im 11/30  brain]
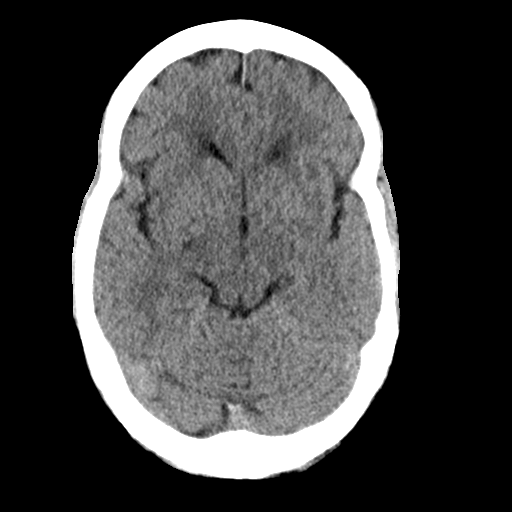
[im 15/30  brain]
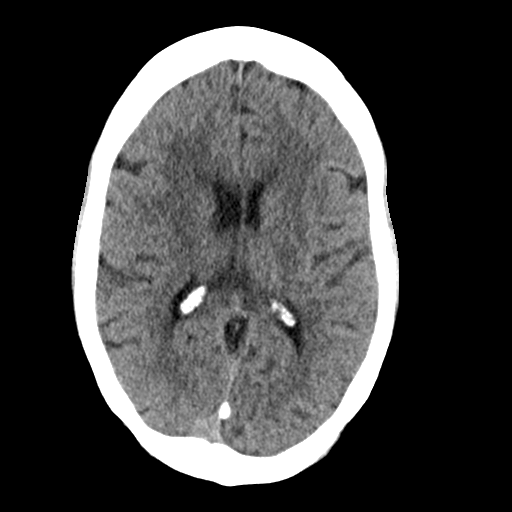
[im 19/30  brain]
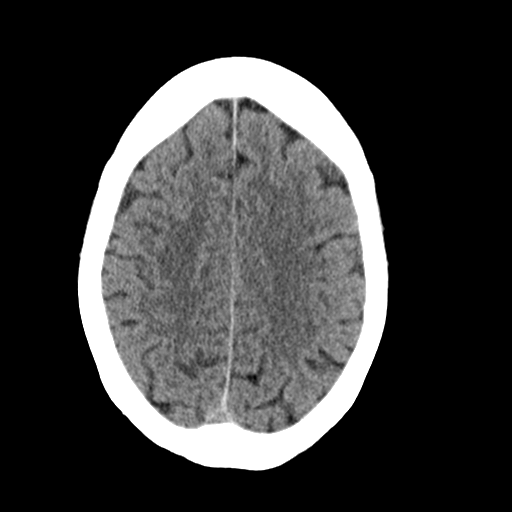
[im 19/30  bone]
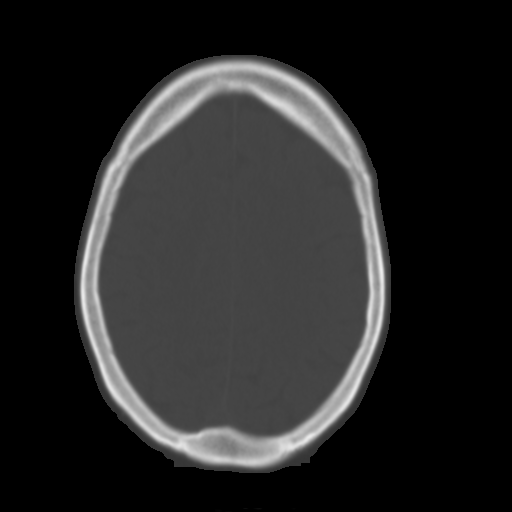
[im 22/30  brain]
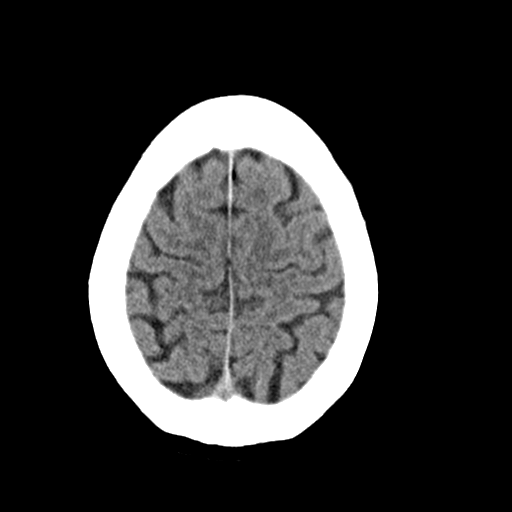
[im 26/30  brain]
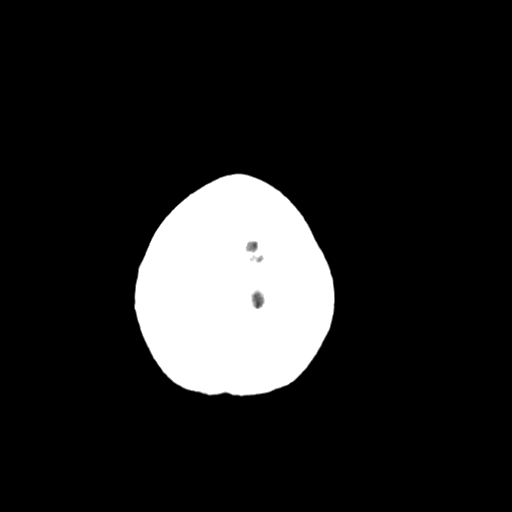

[Series 3: head bone · axial · 0.40mm/px · z∈[+644,+674]mm · 3 of 75 slices shown]
[im 8/75  bone]
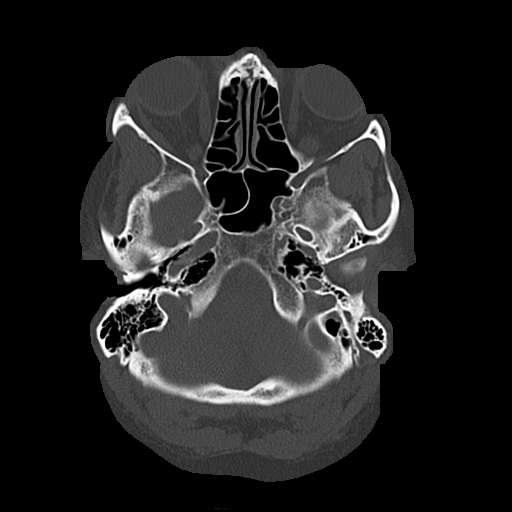
[im 15/75  bone]
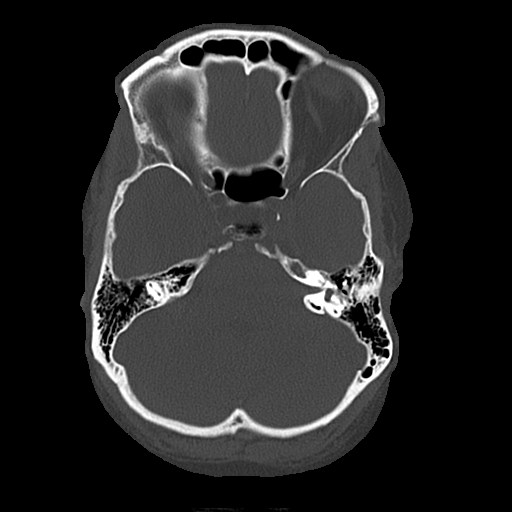
[im 23/75  bone]
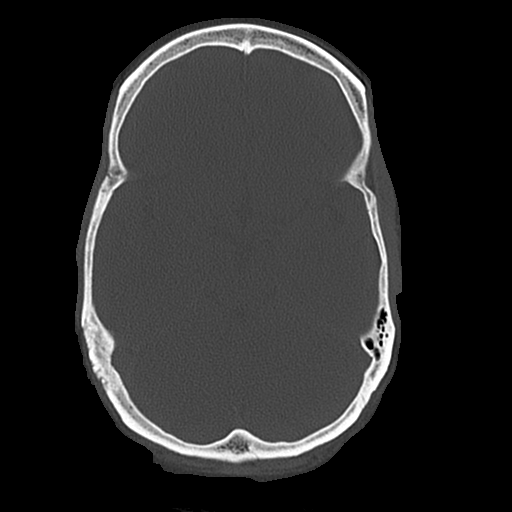

[Series 4: coronal soft · coronal · 0.29mm/px · 3 of 68 slices shown]
[im 23/68  brain]
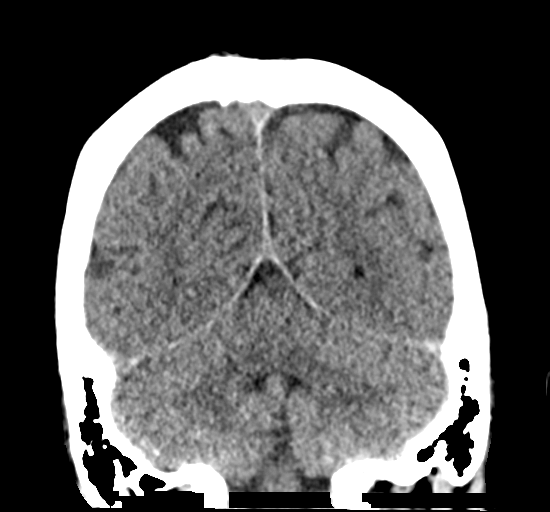
[im 30/68  brain]
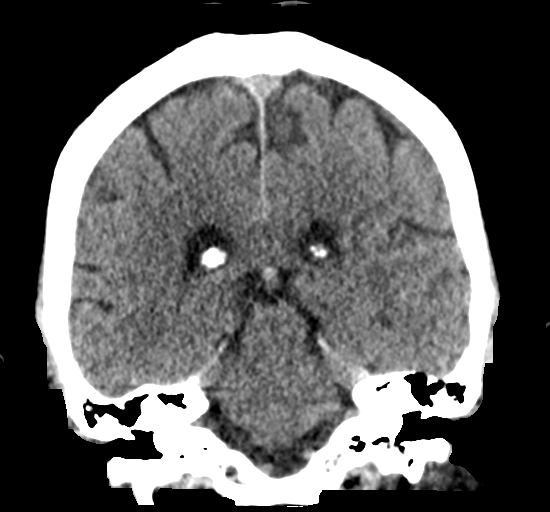
[im 38/68  brain]
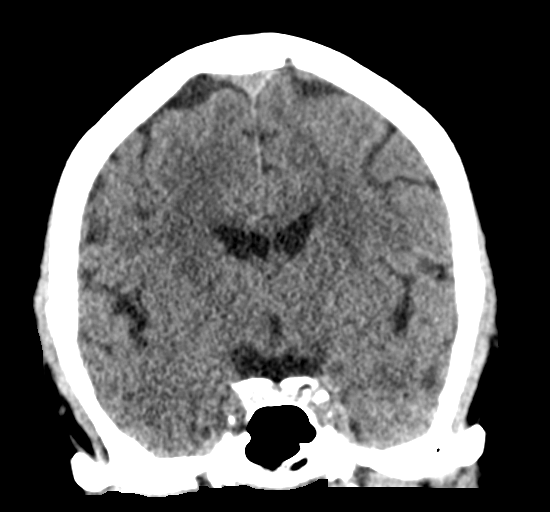

[Series 5: sagittal soft · sagittal · 0.29mm/px · 3 of 54 slices shown]
[im 18/54  brain]
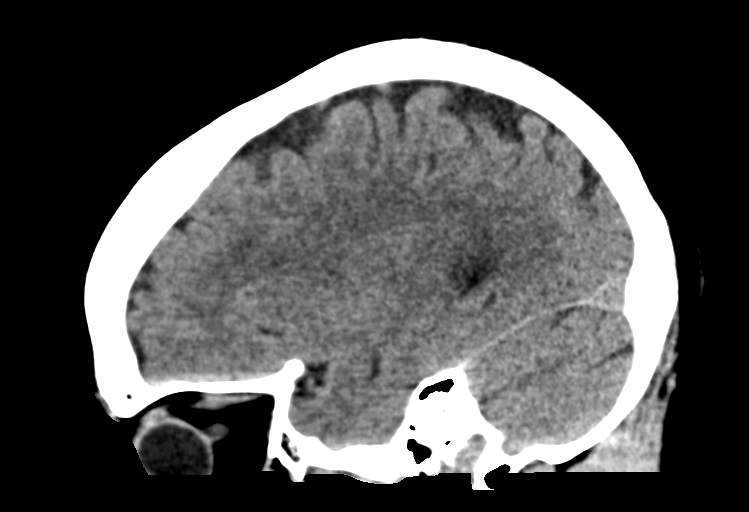
[im 27/54  brain]
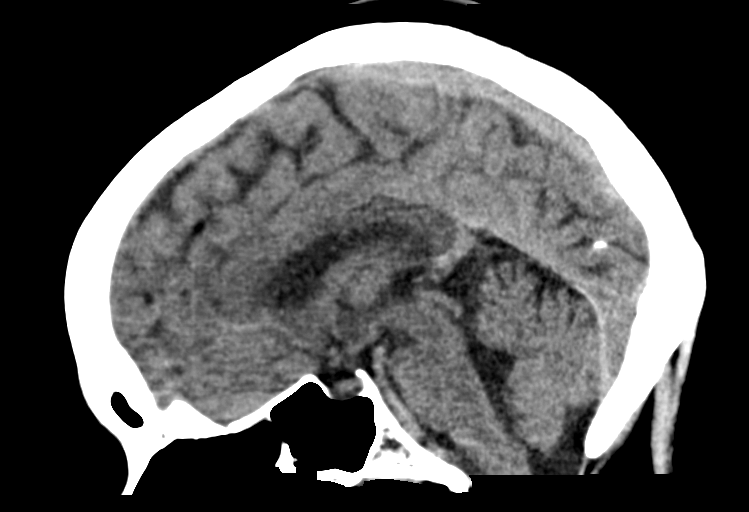
[im 36/54  brain]
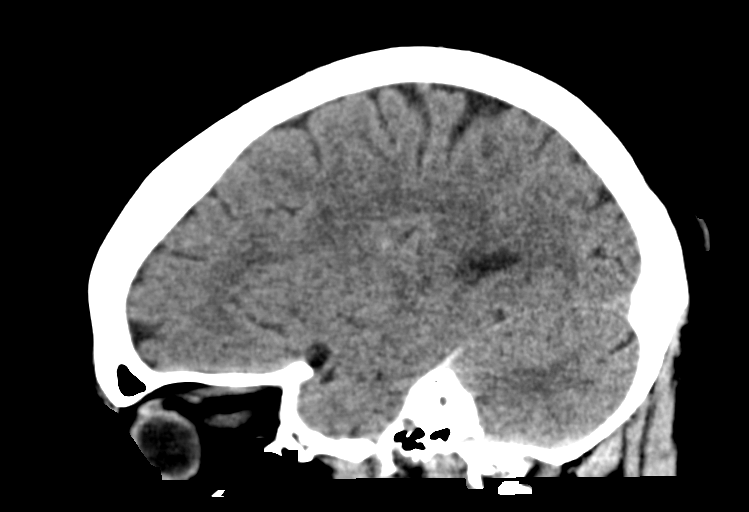

[16 of 47 positions shown; findings below may reference images not displayed]

FINDINGS: Brain: Normal cerebral volume. No midline shift, mass effect, or
evidence of intracranial mass lesion. No ventriculomegaly.

Moderately advanced patchy bilateral cerebral white matter
hypodensity. Deep gray matter nuclei appear relatively spared. No
acute or chronic cortically based infarct identified. No acute
intracranial hemorrhage identified.

Vascular: Calcified atherosclerosis at the skull base. No suspicious
intracranial vascular hyperdensity.

Skull: No osseous abnormality identified.

Sinuses/Orbits: Visualized paranasal sinuses and mastoids are clear.

Other: Visualized orbits and scalp soft tissues are within normal
limits.
IMPRESSION: Advanced for age cerebral white matter changes, nonspecific but most
commonly due to chronic small vessel disease.

## 2023-07-05 ENCOUNTER — Ambulatory Visit (INDEPENDENT_AMBULATORY_CARE_PROVIDER_SITE_OTHER)

## 2023-07-05 ENCOUNTER — Ambulatory Visit: Admitting: Podiatry

## 2023-07-05 ENCOUNTER — Encounter: Payer: Self-pay | Admitting: Podiatry

## 2023-07-05 VITALS — Ht 69.0 in | Wt 193.2 lb

## 2023-07-05 DIAGNOSIS — M79671 Pain in right foot: Secondary | ICD-10-CM

## 2023-07-05 DIAGNOSIS — M7741 Metatarsalgia, right foot: Secondary | ICD-10-CM

## 2023-07-05 DIAGNOSIS — L84 Corns and callosities: Secondary | ICD-10-CM | POA: Diagnosis not present

## 2023-07-05 DIAGNOSIS — M7751 Other enthesopathy of right foot: Secondary | ICD-10-CM

## 2023-07-05 DIAGNOSIS — M7742 Metatarsalgia, left foot: Secondary | ICD-10-CM | POA: Diagnosis not present

## 2023-07-05 NOTE — Patient Instructions (Signed)
  VISIT SUMMARY: Today, you were seen for significant pain in your right foot due to a callus under the second metatarsal head. You also mentioned a similar issue developing on your other foot. We discussed your work conditions, which require you to wear steel-toed shoes and stand on cement floors, contributing to your foot pain. You do not have diabetes, but you have been advised to make dietary changes due to borderline glucose levels. During the visit, the callus was debrided, and salicylic acid was applied to help remove dead skin.  YOUR PLAN: -METATARSALGIA WITH CALLUS FORMATION: Metatarsalgia is pain in the ball of the foot, often caused by pressure on the metatarsal bones. In your case, this is exacerbated by the callus formation under the second metatarsal head. Today, we debrided the callus and applied salicylic acid to help remove dead skin. You should use over-the-counter salicylic acid (40%) at home, especially at night to prevent it from rubbing off during the day. We will refer you to an orthotist for new orthotic insoles with pressure-relieving modifications. Additionally, you will receive a letter for work accommodations to allow you to sit more often. If the condition worsens, surgical intervention to shorten the bone may be considered.  INSTRUCTIONS: Please follow up with the orthotist for new insoles. Use the over-the-counter salicylic acid (40%) at night as recommended. If your condition worsens or you have any concerns, please schedule another appointment.                      Contains text generated by Abridge.                                 Contains text generated by Abridge.

## 2023-07-09 ENCOUNTER — Encounter: Payer: Self-pay | Admitting: Podiatry

## 2023-07-09 NOTE — Progress Notes (Signed)
  Subjective:  Patient ID: Daisy Stephens, female    DOB: 10/18/1960,  MRN: [id number]  Chief Complaint  Patient presents with   Foot Pain    Pt is here due to right foot pain pt has a wart or callous on the bottom of the foot.    Discussed the use of AI scribe software for clinical note transcription with the patient, who gave verbal consent to proceed.  History of Present Illness Daisy Stephens is a 63 year old female who presents with right foot pain and callus formation.  She experiences significant pain in her right foot, specifically at the site of a callus formation under the second metatarsal head. The area is described as 'real sore,' and a similar issue is developing on the other foot, though it is not currently painful.  She recalls a previous visit where the callus was scraped and an insole with a hole was made to alleviate pressure, but it did not fit well in her work shoes, which are steel-toed. She did not continue using the insole due to discomfort and lack of fit.  She denies having diabetes but mentions being advised to change her diet as she may be 'on the border.' She has never been on medication for diabetes.  Her work requires her to wear steel-toed shoes and stand on cement floors, which she believes contributes to her foot pain. She is currently on vacation until July 7th and is concerned about managing her foot pain during this time.      Objective:    Physical Exam VASCULAR: DP and PT pulse palpable. Foot is warm and well-perfused. Capillary fill time is brisk. DERMATOLOGIC: Normal skin turgor, texture, and temperature. No open lesions, rashes, or ulcerations. NEUROLOGIC: Normal sensation to light touch and pressure. No paresthesias. ORTHOPEDIC: Smooth, pain-free range of motion of all examined joints. No ecchymosis or bruising. No gross deformity. No pain to palpation. Prominent second metatarsal head plantar with large diffuse callus on right foot.   No  images are attached to the encounter.    Results Procedure: Callus debridement Description: Debrided the callus on the right foot and applied salicylic acid.   Assessment:   1. Metatarsalgia of both feet   2. Callus of foot   3. Pain in right foot      Plan:  Patient was evaluated and treated and all questions answered.  Assessment and Plan Assessment & Plan Metatarsalgia with callus formation Chronic metatarsalgia with callus formation on the right foot at the second metatarsal head, exacerbated by pressure from steel-toed shoes and hard cement floors at work. The callus causes significant pain and discomfort. No diabetes, but dietary changes have been recommended due to borderline glucose levels. The callus was debrided today, and salicylic acid was applied to aid in further removal of dead skin. Surgical intervention to shorten the bone may be considered if the condition worsens despite treatment. - Debride callus on right foot. - Apply salicylic acid to the affected area. - Recommend over-the-counter salicylic acid (40%) for home use. - Refer to orthotist for new orthotic insoles with pressure-relieving modifications. - Provide a letter for work accommodations to allow sitting. - Advise on the use of salicylic acid at night to prevent rubbing off during the day. - Schedule follow-up with orthotist for new insoles.      Return if symptoms worsen or fail to improve.

## 2023-08-02 ENCOUNTER — Ambulatory Visit

## 2023-08-02 NOTE — Progress Notes (Signed)
 Orthotics   Patient was present and evaluated for Custom molded foot orthotics. Patient will benefit from CFO's to provide total contact to BIL MLA's helping to balance and distribute body weight more evenly across BIL feet helping to reduce plantar pressure and pain. Orthotic will also encourage FF / RF alignment  Patient was scanned today and will return for fitting upon receipt

## 2023-08-15 ENCOUNTER — Telehealth: Payer: Self-pay

## 2023-08-15 NOTE — Telephone Encounter (Signed)
 LVM to schedule orthotic fitting/ pu

## 2023-12-21 ENCOUNTER — Telehealth: Payer: Self-pay

## 2023-12-21 NOTE — Telephone Encounter (Signed)
 I tried to reach out to the patient to schedule an appointment to PUO. Unable to reach patient. I LVM requesting that she call us  back to schedule an appointment.

## 2023-12-24 NOTE — Telephone Encounter (Signed)
 Tried to reach out to patient to have her appointment on 12/27/23 r/s. Unable to connect LVM to have patient call us  back and r/s appointment.

## 2023-12-27 ENCOUNTER — Other Ambulatory Visit

## 2024-01-01 ENCOUNTER — Ambulatory Visit

## 2024-01-01 DIAGNOSIS — M67972 Unspecified disorder of synovium and tendon, left ankle and foot: Secondary | ICD-10-CM | POA: Diagnosis not present

## 2024-01-01 DIAGNOSIS — M7741 Metatarsalgia, right foot: Secondary | ICD-10-CM | POA: Diagnosis not present

## 2024-01-01 DIAGNOSIS — M67971 Unspecified disorder of synovium and tendon, right ankle and foot: Secondary | ICD-10-CM

## 2024-01-01 DIAGNOSIS — M7742 Metatarsalgia, left foot: Secondary | ICD-10-CM

## 2024-01-01 NOTE — Progress Notes (Signed)
 ORTHOTIC DISPENSING:   Reason for Visit:         Fitting and Delivery of Custom Fabricated Foot Orthoses Patient Report:            Patient reports comfort and is satisfied with device.   OBJECTIVE DATA: Patient History / Diagnosis:    No change in pathology Provided Device:                     Functional foot orthoses   GOAL OF ORTHOSIS - Improve gait - Decrease energy expenditure - Improve Balance - Provide Triplanar stability of foot complex - Facilitate motion   ACTIONS PERFORMED Patient was fit with custom foot orthoses   Patient was provided with verbal and written instruction and demonstration regarding wear, care, proper fit, function, and use of the orthosis.    Patient was also provided with verbal instruction regarding how to report any failures or malfunctions of the orthosis and necessary follow up care. Patient was also instructed to contact our office regarding any change in status that may affect the function of the orthosis.   Patient demonstrated understanding of all instructions.  Daisy Stephens, DPM
